# Patient Record
Sex: Male | Born: 1985
Health system: Southern US, Community
[De-identification: ages and names within clinical notes are randomized; demographics above are authoritative.]

## PROBLEM LIST (undated history)

## (undated) DIAGNOSIS — N200 Calculus of kidney: Secondary | ICD-10-CM

## (undated) DIAGNOSIS — K219 Gastro-esophageal reflux disease without esophagitis: Secondary | ICD-10-CM

## (undated) DIAGNOSIS — E669 Obesity, unspecified: Secondary | ICD-10-CM

## (undated) HISTORY — PX: HIATAL HERNIA REPAIR: SHX195

## (undated) HISTORY — DX: Calculus of kidney: N20.0

## (undated) HISTORY — DX: Gastro-esophageal reflux disease without esophagitis: K21.9

## (undated) HISTORY — PX: LACERATION REPAIR: SHX5168

## (undated) HISTORY — DX: Obesity, unspecified: E66.9

---

## 2017-01-31 ENCOUNTER — Encounter: Payer: Self-pay | Admitting: Physician Assistant

## 2017-01-31 ENCOUNTER — Ambulatory Visit (INDEPENDENT_AMBULATORY_CARE_PROVIDER_SITE_OTHER): Payer: 59 | Admitting: Physician Assistant

## 2017-01-31 VITALS — BP 145/85 | HR 64 | Ht 69.0 in | Wt 333.0 lb

## 2017-01-31 DIAGNOSIS — R030 Elevated blood-pressure reading, without diagnosis of hypertension: Secondary | ICD-10-CM | POA: Diagnosis not present

## 2017-01-31 DIAGNOSIS — Z973 Presence of spectacles and contact lenses: Secondary | ICD-10-CM | POA: Insufficient documentation

## 2017-01-31 DIAGNOSIS — Z7689 Persons encountering health services in other specified circumstances: Secondary | ICD-10-CM

## 2017-01-31 DIAGNOSIS — Z0000 Encounter for general adult medical examination without abnormal findings: Secondary | ICD-10-CM

## 2017-01-31 NOTE — Patient Instructions (Addendum)
For your blood pressure: - Check blood pressure at home for the next 2 weeks - Check around the same time each day in a relaxed setting (rest for at least 3 minutes, legs uncrossed, arm above level or your heart) - Limit salt. Follow DASH eating plan - Follow-up in 2-3 weeks   Physical Activity Recommendations for modifying lipids and lowering blood pressure Engage in aerobic physical activity to reduce LDL-cholesterol, non-HDL-cholesterol, and blood pressure  Frequency: 3-4 sessions per week  Intensity: moderate to vigorous  Duration: 40 minutes on average  Physical Activity Recommendations for secondary prevention 1. Aerobic exercise  Frequency: 3-5 sessions per week  Intensity: 50-80% capacity  Duration: 20 - 60 minutes  Examples: walking, treadmill, cycling, rowing, stair climbing, and arm/leg ergometry  2. Resistance exercise  Frequency: 2-3 sessions per week  Intensity: 10-15 repetitions/set to moderate fatigue  Duration: 1-3 sets of 8-10 upper and lower body exercises  Examples: calisthenics, elastic bands, cuff/hand weights, dumbbels, free weights, wall pulleys, and weight machines  Heart-Healthy Lifestyle  Eating a diet rich in vegetables, fruits and whole grains: also includes low-fat dairy products, poultry, fish, legumes, and nuts; limit intake of sweets, sugar-sweetened beverages and red meats  Getting regular exercise  Maintaining a healthy weight  Not smoking or getting help quitting  Staying on top of your health; for some people, lifestyle changes alone may not be enough to prevent a heart attack or stroke. In these cases, taking a statin at the right dose will most likely be necessary   DASH Eating Plan DASH stands for "Dietary Approaches to Stop Hypertension." The DASH eating plan is a healthy eating plan that has been shown to reduce high blood pressure (hypertension). It may also reduce your risk for type 2 diabetes, heart disease, and stroke.  The DASH eating plan may also help with weight loss. What are tips for following this plan? General guidelines   Avoid eating more than 2,300 mg (milligrams) of salt (sodium) a day. If you have hypertension, you may need to reduce your sodium intake to 1,500 mg a day.  Limit alcohol intake to no more than 1 drink a day for nonpregnant women and 2 drinks a day for men. One drink equals 12 oz of beer, 5 oz of wine, or 1 oz of hard liquor.  Work with your health care provider to maintain a healthy body weight or to lose weight. Ask what an ideal weight is for you.  Get at least 30 minutes of exercise that causes your heart to beat faster (aerobic exercise) most days of the week. Activities may include walking, swimming, or biking.  Work with your health care provider or diet and nutrition specialist (dietitian) to adjust your eating plan to your individual calorie needs. Reading food labels   Check food labels for the amount of sodium per serving. Choose foods with less than 5 percent of the Daily Value of sodium. Generally, foods with less than 300 mg of sodium per serving fit into this eating plan.  To find whole grains, look for the word "whole" as the first word in the ingredient list. Shopping   Buy products labeled as "low-sodium" or "no salt added."  Buy fresh foods. Avoid canned foods and premade or frozen meals. Cooking   Avoid adding salt when cooking. Use salt-free seasonings or herbs instead of table salt or sea salt. Check with your health care provider or pharmacist before using salt substitutes.  Do not fry foods. Cook foods  using healthy methods such as baking, boiling, grilling, and broiling instead.  Cook with heart-healthy oils, such as olive, canola, soybean, or sunflower oil. Meal planning    Eat a balanced diet that includes:  5 or more servings of fruits and vegetables each day. At each meal, try to fill half of your plate with fruits and vegetables.  Up to  6-8 servings of whole grains each day.  Less than 6 oz of lean meat, poultry, or fish each day. A 3-oz serving of meat is about the same size as a deck of cards. One egg equals 1 oz.  2 servings of low-fat dairy each day.  A serving of nuts, seeds, or beans 5 times each week.  Heart-healthy fats. Healthy fats called Omega-3 fatty acids are found in foods such as flaxseeds and coldwater fish, like sardines, salmon, and mackerel.  Limit how much you eat of the following:  Canned or prepackaged foods.  Food that is high in trans fat, such as fried foods.  Food that is high in saturated fat, such as fatty meat.  Sweets, desserts, sugary drinks, and other foods with added sugar.  Full-fat dairy products.  Do not salt foods before eating.  Try to eat at least 2 vegetarian meals each week.  Eat more home-cooked food and less restaurant, buffet, and fast food.  When eating at a restaurant, ask that your food be prepared with less salt or no salt, if possible. What foods are recommended? The items listed may not be a complete list. Talk with your dietitian about what dietary choices are best for you. Grains  Whole-grain or whole-wheat bread. Whole-grain or whole-wheat pasta. Brown rice. Orpah Cobb. Bulgur. Whole-grain and low-sodium cereals. Pita bread. Low-fat, low-sodium crackers. Whole-wheat flour tortillas. Vegetables  Fresh or frozen vegetables (raw, steamed, roasted, or grilled). Low-sodium or reduced-sodium tomato and vegetable juice. Low-sodium or reduced-sodium tomato sauce and tomato paste. Low-sodium or reduced-sodium canned vegetables. Fruits  All fresh, dried, or frozen fruit. Canned fruit in natural juice (without added sugar). Meat and other protein foods  Skinless chicken or [place]. Ground chicken or [place]. Pork with fat trimmed off. Fish and seafood. Egg whites. Dried beans, peas, or lentils. Unsalted nuts, nut butters, and seeds. Unsalted canned beans. Lean cuts  of beef with fat trimmed off. Low-sodium, lean deli meat. Dairy  Low-fat (1%) or fat-free (skim) milk. Fat-free, low-fat, or reduced-fat cheeses. Nonfat, low-sodium ricotta or cottage cheese. Low-fat or nonfat yogurt. Low-fat, low-sodium cheese. Fats and oils  Soft margarine without trans fats. Vegetable oil. Low-fat, reduced-fat, or light mayonnaise and salad dressings (reduced-sodium). Canola, safflower, olive, soybean, and sunflower oils. Avocado. Seasoning and other foods  Herbs. Spices. Seasoning mixes without salt. Unsalted popcorn and pretzels. Fat-free sweets. What foods are not recommended? The items listed may not be a complete list. Talk with your dietitian about what dietary choices are best for you. Grains  Baked goods made with fat, such as croissants, muffins, or some breads. Dry pasta or rice meal packs. Vegetables  Creamed or fried vegetables. Vegetables in a cheese sauce. Regular canned vegetables (not low-sodium or reduced-sodium). Regular canned tomato sauce and paste (not low-sodium or reduced-sodium). Regular tomato and vegetable juice (not low-sodium or reduced-sodium). Rosita Fire. Olives. Fruits  Canned fruit in a light or heavy syrup. Fried fruit. Fruit in cream or butter sauce. Meat and other protein foods  Fatty cuts of meat. Ribs. Fried meat. Tomasa Blase. Sausage. Bologna and other processed lunch meats. Salami. Fatback. Hotdogs. Bratwurst.  Salted nuts and seeds. Canned beans with added salt. Canned or smoked fish. Whole eggs or egg yolks. Chicken or [place] with skin. Dairy  Whole or 2% milk, cream, and half-and-half. Whole or full-fat cream cheese. Whole-fat or sweetened yogurt. Full-fat cheese. Nondairy creamers. Whipped toppings. Processed cheese and cheese spreads. Fats and oils  Butter. Stick margarine. Lard. Shortening. Ghee. Bacon fat. Tropical oils, such as coconut, palm kernel, or palm oil. Seasoning and other foods  Salted popcorn and pretzels. Onion salt, garlic  salt, seasoned salt, table salt, and sea salt. Worcestershire sauce. Tartar sauce. Barbecue sauce. Teriyaki sauce. Soy sauce, including reduced-sodium. Steak sauce. Canned and packaged gravies. Fish sauce. Oyster sauce. Cocktail sauce. Horseradish that you find on the shelf. Ketchup. Mustard. Meat flavorings and tenderizers. Bouillon cubes. Hot sauce and Tabasco sauce. Premade or packaged marinades. Premade or packaged taco seasonings. Relishes. Regular salad dressings. Where to find more information:  National Heart, Lung, and Blood Institute: PopSteam.is  American Heart Association: www.heart.org Summary  The DASH eating plan is a healthy eating plan that has been shown to reduce high blood pressure (hypertension). It may also reduce your risk for type 2 diabetes, heart disease, and stroke.  With the DASH eating plan, you should limit salt (sodium) intake to 2,300 mg a day. If you have hypertension, you may need to reduce your sodium intake to 1,500 mg a day.  When on the DASH eating plan, aim to eat more fresh fruits and vegetables, whole grains, lean proteins, low-fat dairy, and heart-healthy fats.  Work with your health care provider or diet and nutrition specialist (dietitian) to adjust your eating plan to your individual calorie needs. This information is not intended to replace advice given to you by your health care provider. Make sure you discuss any questions you have with your health care provider. Document Released: 09/15/2011 Document Revised: 09/19/2016 Document Reviewed: 09/19/2016 Elsevier Interactive Patient Education  2017 [employer].

## 2017-01-31 NOTE — Progress Notes (Signed)
HPI:                                                                Joshua Adams is a 31 y.o. male who presents to [facility]: Primary Care [facility] Medicine today to establish care and for CPE  Current Concerns include none  Health Maintenance Health Maintenance  Topic Date Due  . HIV Screening  05/23/2001  . TETANUS/TDAP  05/23/2005  . INFLUENZA VACCINE  05/10/2017     Health Habits  Diet: poor  Exercise: gym, cardio & weights x 90 mins 3 days per week  Past Medical History:  Diagnosis Date  . GERD (gastroesophageal reflux disease)   . Nephrolithiasis   . Obesity    Past Surgical History:  Procedure Laterality Date  . HIATAL HERNIA REPAIR     Social History  Substance Use Topics  . Smoking status: Never Smoker  . Smokeless tobacco: Never Used  . Alcohol use No   family history includes Diabetes in his father; Stroke in his maternal grandmother.  ROS: negative except as noted in the HPI  Medications: No current outpatient prescriptions on file.   No current facility-administered medications for this visit.    No Known Allergies     Objective:  BP (!) 145/85   Pulse 64   Ht  (1.753 m)   Wt (!) 333 lb (151 kg)   BMI 49.18 kg/m  Gen: well-groomed, morbidly obese, cooperative, not ill-appearing, no distress HEENT: normal conjunctiva, wearing glasses, TM's clear, oropharynx clear, moist mucus membranes, no thyromegaly or tenderness Pulm: Normal work of breathing, normal phonation, clear to auscultation bilaterally CV: Normal rate, regular rhythm, s1 and s2 distinct, no murmurs, clicks or rubs, no carotid bruit GI: abdomen obese, soft, nondistended, nontender Neuro: alert and oriented x 3, EOM's intact, PERRLA, DTR's intact, normal tone, no tremor MSK: moving all extremities, normal gait and station, no peripheral edema Skin: warm and dry, no rashes or lesions on exposed skin Psych: normal affect, euthymic mood, normal speech and  thought content  Depression screen [facility] 2/9 01/31/2017  Decreased Interest 0  Down, Depressed, Hopeless 0  PHQ - 2 Score 0     Assessment and Plan: 31 y.o. male with   1. Encounter to establish care   2. Annual physical exam - CBC - Comprehensive metabolic panel - Lipid Panel w/reflex Direct LDL - declined STI testing - negative depression screen  3. Severe obesity (BMI >= 40) (HCC) - Hemoglobin A1c, Lipid panel - counseled on heart healthy lifestyle to include regular cardiovascular exercise - DASH eating plan  4. Elevated blood pressure reading - instructed on how to monitor BP's at home and provided with log - DASH eating plan - follow-up in 2 weeks  Patient education and anticipatory guidance given Patient agrees with treatment plan Follow-up in 2 weeks or sooner as needed  Levonne Hubert PA-C

## 2017-02-01 LAB — COMPREHENSIVE METABOLIC PANEL
ALBUMIN: 4.3 g/dL (ref 3.6–5.1)
ALT: 17 U/L (ref 9–46)
AST: 19 U/L (ref 10–40)
Alkaline Phosphatase: 73 U/L (ref 40–115)
BUN: 14 mg/dL (ref 7–25)
CHLORIDE: 103 mmol/L (ref 98–110)
CO2: 30 mmol/L (ref 20–31)
Calcium: 9.1 mg/dL (ref 8.6–10.3)
Creat: 1 mg/dL (ref 0.60–1.35)
Glucose, Bld: 89 mg/dL (ref 65–99)
POTASSIUM: 4 mmol/L (ref 3.5–5.3)
Sodium: 139 mmol/L (ref 135–146)
TOTAL PROTEIN: 6.7 g/dL (ref 6.1–8.1)
Total Bilirubin: 0.5 mg/dL (ref 0.2–1.2)

## 2017-02-01 LAB — LIPID PANEL W/REFLEX DIRECT LDL
CHOL/HDL RATIO: 2.6 ratio (ref <5.0)
Cholesterol: 121 mg/dL (ref <200)
HDL: 47 mg/dL (ref >40)
LDL-Cholesterol: 57 mg/dL
NON-HDL CHOLESTEROL (CALC): 74 mg/dL (ref <130)
TRIGLYCERIDES: 83 mg/dL (ref <150)

## 2017-02-01 LAB — CBC
HCT: 45.2 % (ref 38.5–50.0)
Hemoglobin: 15.2 g/dL (ref 13.2–17.1)
MCH: 30.8 pg (ref 27.0–33.0)
MCHC: 33.6 g/dL (ref 32.0–36.0)
MCV: 91.5 fL (ref 80.0–100.0)
MPV: 12.4 fL (ref 7.5–12.5)
PLATELETS: 187 10*3/uL (ref 140–400)
RBC: 4.94 MIL/uL (ref 4.20–5.80)
RDW: 13.4 % (ref 11.0–15.0)
WBC: 9.2 10*3/uL (ref 3.8–10.8)

## 2017-02-01 LAB — HEMOGLOBIN A1C
Hgb A1c MFr Bld: 4.8 % (ref <5.7)
Mean Plasma Glucose: 91 mg/dL

## 2017-02-27 ENCOUNTER — Ambulatory Visit: Payer: 59 | Admitting: Physician Assistant

## 2018-08-22 ENCOUNTER — Emergency Department (INDEPENDENT_AMBULATORY_CARE_PROVIDER_SITE_OTHER): Payer: BLUE CROSS/BLUE SHIELD

## 2018-08-22 ENCOUNTER — Encounter: Payer: Self-pay | Admitting: Emergency Medicine

## 2018-08-22 ENCOUNTER — Emergency Department
Admission: EM | Admit: 2018-08-22 | Discharge: 2018-08-22 | Disposition: A | Discharge status: Home / Self Care | Payer: BLUE CROSS/BLUE SHIELD | Source: Home / Self Care | Attending: Family Medicine | Admitting: Family Medicine

## 2018-08-22 DIAGNOSIS — R05 Cough: Secondary | ICD-10-CM | POA: Diagnosis not present

## 2018-08-22 DIAGNOSIS — R509 Fever, unspecified: Secondary | ICD-10-CM | POA: Diagnosis not present

## 2018-08-22 DIAGNOSIS — J209 Acute bronchitis, unspecified: Secondary | ICD-10-CM | POA: Diagnosis not present

## 2018-08-22 MED ORDER — AZITHROMYCIN 250 MG PO TABS: ORAL_TABLET | ORAL | 0 refills | Status: DC

## 2018-08-22 NOTE — ED Triage Notes (Signed)
Pt c/o fever and cough with chest congestion x1 week. Tmax 100.

## 2018-08-22 NOTE — ED Provider Notes (Signed)
[facility]    CSN: [id number] Arrival date & time: 08/22/18  1645     History   Chief Complaint Chief Complaint  Patient presents with  . Fever    HPI Joshua Adams is a 32 y.o. male.   Patient developed a mild cough about 8 or 9 days ago, without sore throat or nasal congestion.  His non-productive cough has persisted and yesterday he developed chills.  This morning he developed fever to 100.9.  He denies pleuritic pain or shortness of breath.  The history is provided by the patient.    Past Medical History:  Diagnosis Date  . GERD (gastroesophageal reflux disease)   . Nephrolithiasis   . Obesity     Patient Active Problem List   Diagnosis Date Noted  . Severe obesity (BMI >= 40) (HCC) 01/31/2017  . Elevated blood pressure reading 01/31/2017  . Wears glasses 01/31/2017    Past Surgical History:  Procedure Laterality Date  . HIATAL HERNIA REPAIR         Home Medications    Prior to Admission medications   Medication Sig Start Date End Date Taking? Authorizing Provider  azithromycin (ZITHROMAX Z-PAK) 250 MG tablet Take 2 tabs today; then begin one tab once daily for 4 more days. 08/22/18   Lattie HawBeese, Jovana Rembold A, MD    Family History Family History  Problem Relation Age of Onset  . Diabetes Father   . Stroke Maternal Grandmother   . Cancer Neg Hx   . Heart attack Neg Hx     Social History Social History   Tobacco Use  . Smoking status: Never Smoker  . Smokeless tobacco: Never Used  Substance Use Topics  . Alcohol use: No  . Drug use: No     Allergies   Patient has no known allergies.   Review of Systems Review of Systems No sore throat + cough No pleuritic pain No wheezing ? nasal congestion No post-nasal drainage No sinus pain/pressure No itchy/red eyes No earache No hemoptysis No SOB No fever, + chills No nausea No vomiting No abdominal pain No diarrhea No urinary symptoms No skin rash + fatigue No  myalgias No headache Used OTC meds without relief   Physical Exam Triage Vital Signs ED Triage Vitals  Enc Vitals Group     BP 08/22/18 1721 (!) 156/75     Pulse Rate 08/22/18 1721 90     Resp --      Temp 08/22/18 1721 99.5 F (37.5 C)     Temp Source 08/22/18 1721 Oral     SpO2 08/22/18 1721 97 %     Weight 08/22/18 1722 (!) 315 lb (142.9 kg)     Height --      Head Circumference --      Peak Flow --      Pain Score 08/22/18 1721 0     Pain Loc --      Pain Edu? --      Excl. in GC? --    No data found.  Updated Vital Signs BP (!) 156/75 (BP Location: Right Arm)   Pulse 90   Temp 99.5 F (37.5 C) (Oral)   Wt (!) 142.9 kg   SpO2 97%   BMI 46.52 kg/m   Visual Acuity Right Eye Distance:   Left Eye Distance:   Bilateral Distance:    Right Eye Near:   Left Eye Near:    Bilateral Near:     Physical Exam Nursing notes  and Vital Signs reviewed. Appearance:  Patient appears stated age, and in no acute distress Eyes:  Pupils are equal, round, and reactive to light and accomodation.  Extraocular movement is intact.  Conjunctivae are not inflamed  Ears:  Canals normal.  Tympanic membranes normal.  Nose:  Mildly congested turbinates.  No sinus tenderness.  Pharynx:  Normal Neck:  Supple.  Enlarged posterior/lateral nodes are palpated bilaterally, tender to palpation on the left.   Lungs:  Clear to auscultation.  Breath sounds are equal.  Moving air well. Heart:  Regular rate and rhythm without murmurs, rubs, or gallops.  Rate 104 Abdomen:  Nontender without masses or hepatosplenomegaly.  Bowel sounds are present.  No CVA or flank tenderness.  Extremities:  No edema.  Skin:  No rash present.    UC Treatments / Results  Labs (all labs ordered are listed, but only abnormal results are displayed) Labs Reviewed - No data to display  EKG None  Radiology Dg Chest 2 View  Result Date: 08/22/2018 CLINICAL DATA:  Fever and cough for 1 week. EXAM: CHEST - 2 VIEW  COMPARISON:  None. FINDINGS: The heart size and mediastinal contours are within normal limits. Both lungs are clear. Mild thoracic dextroscoliosis noted. IMPRESSION: No active cardiopulmonary disease. Electronically Signed   By: Myles Rosenthal M.D.   On: 08/22/2018 18:14    Procedures Procedures (including critical care time)  Medications Ordered in UC Medications - No data to display  Initial Impression / Assessment and Plan / UC Course  I have reviewed the triage vital signs and the nursing notes.  Pertinent labs & imaging results that were available during my care of the patient were reviewed by me and considered in my medical decision making (see chart for details).    Begin Z-pak for atypical coverage. Followup with Family Doctor if not improved in one week.    Final Clinical Impressions(s) / UC Diagnoses   Final diagnoses:  Acute bronchitis, unspecified organism     Discharge Instructions     Take plain guaifenesin (1200mg  extended release tabs such as Mucinex) twice daily, with plenty of water, for cough and congestion.  If needed, may add Pseudoephedrine (30mg , one or two every 4 to 6 hours) for sinus congestion.  Get adequate rest.   May take Delsym Cough Suppressant at bedtime for nighttime cough.  Stop all antihistamines for now, and other non-prescription cough/cold preparations. May take Ibuprofen 200mg , 4 tabs every 8 hours with food for chest/sternum discomfort.      ED Prescriptions    Medication Sig Dispense Auth. Provider   azithromycin (ZITHROMAX Z-PAK) 250 MG tablet Take 2 tabs today; then begin one tab once daily for 4 more days. 6 tablet Lattie Haw, MD        Lattie Haw, MD 08/25/18 [phone]

## 2018-08-22 NOTE — Discharge Instructions (Addendum)
Take plain guaifenesin (1200mg  extended release tabs such as Mucinex) twice daily, with plenty of water, for cough and congestion.  If needed, may add Pseudoephedrine (30mg , one or two every 4 to 6 hours) for sinus congestion.  Get adequate rest.   May take Delsym Cough Suppressant at bedtime for nighttime cough.  Stop all antihistamines for now, and other non-prescription cough/cold preparations. May take Ibuprofen 200mg , 4 tabs every 8 hours with food for chest/sternum discomfort.

## 2018-08-23 ENCOUNTER — Ambulatory Visit: Payer: 59 | Admitting: Osteopathic Medicine

## 2019-05-14 ENCOUNTER — Ambulatory Visit: Payer: BLUE CROSS/BLUE SHIELD | Admitting: Physician Assistant

## 2020-03-14 DIAGNOSIS — Y92828 Other wilderness area as the place of occurrence of the external cause: Secondary | ICD-10-CM | POA: Diagnosis not present

## 2020-03-14 DIAGNOSIS — S61217A Laceration without foreign body of left little finger without damage to nail, initial encounter: Secondary | ICD-10-CM | POA: Diagnosis not present

## 2020-03-14 DIAGNOSIS — W269XXA Contact with unspecified sharp object(s), initial encounter: Secondary | ICD-10-CM | POA: Diagnosis not present

## 2020-03-14 DIAGNOSIS — W268XXA Contact with other sharp object(s), not elsewhere classified, initial encounter: Secondary | ICD-10-CM | POA: Diagnosis not present

## 2020-03-14 DIAGNOSIS — Y9389 Activity, other specified: Secondary | ICD-10-CM | POA: Diagnosis not present

## 2020-03-14 DIAGNOSIS — M79645 Pain in left finger(s): Secondary | ICD-10-CM | POA: Diagnosis not present

## 2020-03-16 ENCOUNTER — Encounter: Payer: Self-pay | Admitting: Medical-Surgical

## 2020-03-16 ENCOUNTER — Ambulatory Visit (INDEPENDENT_AMBULATORY_CARE_PROVIDER_SITE_OTHER): Payer: BC Managed Care – PPO | Admitting: Medical-Surgical

## 2020-03-16 VITALS — BP 140/77 | HR 64 | Temp 98.3°F | Wt 315.0 lb

## 2020-03-16 DIAGNOSIS — Z09 Encounter for follow-up examination after completed treatment for conditions other than malignant neoplasm: Secondary | ICD-10-CM | POA: Diagnosis not present

## 2020-03-16 DIAGNOSIS — S61217A Laceration without foreign body of left little finger without damage to nail, initial encounter: Secondary | ICD-10-CM | POA: Insufficient documentation

## 2020-03-16 DIAGNOSIS — S61217D Laceration without foreign body of left little finger without damage to nail, subsequent encounter: Secondary | ICD-10-CM | POA: Diagnosis not present

## 2020-03-16 MED ORDER — HYDROCODONE-ACETAMINOPHEN 5-325 MG PO TABS: 1.0000 | ORAL_TABLET | Freq: Three times a day (TID) | ORAL | 0 refills | Status: AC | PRN

## 2020-03-16 NOTE — Progress Notes (Signed)
New Patient Office Visit  Subjective:  Patient ID: Tommey Barret, male    DOB: Nov 07, 1985  Age: 34 y.o. MRN: [id number]  CC: No chief complaint on file.   HPI Lamar Meter presents to establish care.  Was seen at the ER on Saturday after injuring his left fifth digit on his fishing boat.  Reports he was in the ED for approximately 6 hours prior to having the sutures placed.  They discharged him from the emergency room with Keflex 4 times daily and instructions to keep the dressing dry for at least 48 hours.  He is taking Keflex as prescribed, tolerating well without side effects.  Taking ibuprofen/Tylenol for pain which are ineffective.  Reports the pain is mostly located at the tip of his finger where they identified a small fracture in addition to the complex laceration.  Denies fever, chills, shortness of breath, chest pain.  Past Medical History:  Diagnosis Date  . GERD (gastroesophageal reflux disease)   . Nephrolithiasis   . Obesity     Past Surgical History:  Procedure Laterality Date  . HIATAL HERNIA REPAIR    . LACERATION REPAIR Left    Pinky finger - 12 sutures    Family History  Problem Relation Age of Onset  . Diabetes Father   . Stroke Maternal Grandmother   . Cancer Neg Hx   . Heart attack Neg Hx     Social History   Socioeconomic History  . Marital status: Married    Spouse name: Not on file  . Number of children: Not on file  . Years of education: Not on file  . Highest education level: Not on file  Occupational History  . Occupation: Education officer, museum: [employer]  Tobacco Use  . Smoking status: Never Smoker  . Smokeless tobacco: Never Used  Substance and Sexual Activity  . Alcohol use: Never  . Drug use: Never  . Sexual activity: Yes    Partners: Female  Other Topics Concern  . Not on file  Social History Narrative  . Not on file   Social Determinants of Health   Financial Resource Strain:   . Difficulty of Paying  Living Expenses:   Food Insecurity:   . Worried About Programme researcher, broadcasting/film/video in the Last Year:   . Barista in the Last Year:   Transportation Needs:   . Freight forwarder (Medical):   Marland Kitchen Lack of Transportation (Non-Medical):   Physical Activity:   . Days of Exercise per Week:   . Minutes of Exercise per Session:   Stress:   . Feeling of Stress :   Social Connections:   . Frequency of Communication with Friends and Family:   . Frequency of Social Gatherings with Friends and Family:   . Attends Religious Services:   . Active Member of Clubs or Organizations:   . Attends Banker Meetings:   Marland Kitchen Marital Status:   Intimate Partner Violence:   . Fear of Current or Ex-Partner:   . Emotionally Abused:   Marland Kitchen Physically Abused:   . Sexually Abused:     ROS Review of Systems  Constitutional: Negative for chills, fatigue and fever.  Respiratory: Negative for cough, chest tightness and shortness of breath.   Cardiovascular: Negative for chest pain, palpitations and leg swelling.  Skin: Positive for wound (See clinical photos).    Objective:   Today's Vitals: BP 140/77 (BP Location: Right Arm, Patient Position: Sitting, Cuff  Size: Large)   Pulse 64   Temp 98.3 F (36.8 C) (Oral)   Wt (!) 315 lb 0.6 oz (142.9 kg)   BMI 46.52 kg/m   Physical Exam Vitals reviewed.  Constitutional:      General: He is not in acute distress.    Appearance: Normal appearance. He is obese.  HENT:     Head: Normocephalic and atraumatic.  Cardiovascular:     Rate and Rhythm: Normal rate and regular rhythm.     Pulses: Normal pulses.     Heart sounds: Normal heart sounds. No murmur. No friction rub. No gallop.   Pulmonary:     Effort: Pulmonary effort is normal. No respiratory distress.     Breath sounds: Normal breath sounds. No wheezing.  Skin:    General: Skin is warm and dry.     Comments: See clinical photo.  Dressing removed, old serosanguineous drainage present.    Neurological:     Mental Status: He is alert and oriented to person, place, and time.     Cranial Nerves: No cranial nerve deficit.  Psychiatric:        Mood and Affect: Mood normal.        Behavior: Behavior normal.        Thought Content: Thought content normal.        Judgment: Judgment normal.         Assessment & Plan:   1. Hospital discharge follow-up Patient brought paperwork from his hospital visit.  Paperwork and recommendations reviewed.  2. Laceration of left little finger, foreign body presence unspecified, nail damage status unspecified, subsequent encounter Continue Keflex 4 times daily until course is complete.  Sending in [place] 3 times daily as needed for pain.  Dry gauze dressing replaced to finger.  Advised patient to keep area clean and dry, wash with soap and water daily, and change dry gauze dressing daily.  Reviewed emergency symptoms to monitor for including fever, chills, worsening pain, increasing redness, or purulent drainage.  Patient due to have sutures removed in approximately 12 days.  Patient reports he is already been charged for suture removal at the emergency room he went to so he will probably go back there to have them taken out.  Advised patient that if he does not go back to that emergency room for removal, he is welcome to come here and we will remove them.  Outpatient Encounter Medications as of 03/16/2020  Medication Sig  . cephALEXin (KEFLEX) 500 MG capsule Take 500 mg by mouth 4 (four) times daily.  Marland Kitchen HYDROcodone-acetaminophen (NORCO/VICODIN) 5-325 MG tablet Take 1 tablet by mouth 3 (three) times daily as needed for up to 5 days for moderate pain.  . [DISCONTINUED] azithromycin (ZITHROMAX Z-PAK) 250 MG tablet Take 2 tabs today; then begin one tab once daily for 4 more days. (Patient not taking: Reported on 03/16/2020)   No facility-administered encounter medications on file as of 03/16/2020.    Follow-up: Return in about 12 days (around  03/28/2020) for suture removal.   Samuel Bouche, NP

## 2020-03-27 ENCOUNTER — Ambulatory Visit (INDEPENDENT_AMBULATORY_CARE_PROVIDER_SITE_OTHER): Payer: BC Managed Care – PPO | Admitting: Medical-Surgical

## 2020-03-27 ENCOUNTER — Encounter: Payer: Self-pay | Admitting: Medical-Surgical

## 2020-03-27 ENCOUNTER — Other Ambulatory Visit: Payer: Self-pay

## 2020-03-27 VITALS — BP 145/76 | HR 66 | Temp 98.0°F | Ht 69.0 in | Wt 314.5 lb

## 2020-03-27 DIAGNOSIS — S61217D Laceration without foreign body of left little finger without damage to nail, subsequent encounter: Secondary | ICD-10-CM | POA: Diagnosis not present

## 2020-03-27 NOTE — Progress Notes (Signed)
Subjective:    CC: Suture removal  HPI: Pleasant 34 year old male presenting today for removal of sutures to left fifth finger.  Was seen on 6/7 for hospital discharge after cutting his finger while on his fishing boat.  Was instructed to have the sutures removed after they had been in place for 14 days.  Was treated with prophylactic Keflex which he took without difficulty and tolerated well.  Has been taking good care of his injury, washing it daily with soap and water and keeping it covered to avoid getting it dirty while at work.  Has still had a little bit of drainage from the lateral side but no overt concern for infection.  I reviewed the past medical history, family history, social history, surgical history, and allergies today and no changes were needed.  Please see the problem list section below in epic for further details.  Past Medical History: Past Medical History:  Diagnosis Date  . GERD (gastroesophageal reflux disease)   . Nephrolithiasis   . Obesity    Past Surgical History: Past Surgical History:  Procedure Laterality Date  . HIATAL HERNIA REPAIR    . LACERATION REPAIR Left    Pinky finger - 12 sutures   Social History: Social History   Socioeconomic History  . Marital status: Married    Spouse name: Not on file  . Number of children: Not on file  . Years of education: Not on file  . Highest education level: Not on file  Occupational History  . Occupation: Engineer, structural: [employer]  Tobacco Use  . Smoking status: Never Smoker  . Smokeless tobacco: Never Used  Vaping Use  . Vaping Use: Never used  Substance and Sexual Activity  . Alcohol use: Never  . Drug use: Never  . Sexual activity: Yes    Partners: Female  Other Topics Concern  . Not on file  Social History Narrative  . Not on file   Social Determinants of Health   Financial Resource Strain:   . Difficulty of Paying Living Expenses:   Food Insecurity:   .  Worried About Charity fundraiser in the Last Year:   . Arboriculturist in the Last Year:   Transportation Needs:   . Film/video editor (Medical):   Marland Kitchen Lack of Transportation (Non-Medical):   Physical Activity:   . Days of Exercise per Week:   . Minutes of Exercise per Session:   Stress:   . Feeling of Stress :   Social Connections:   . Frequency of Communication with Friends and Family:   . Frequency of Social Gatherings with Friends and Family:   . Attends Religious Services:   . Active Member of Clubs or Organizations:   . Attends Archivist Meetings:   Marland Kitchen Marital Status:    Family History: Family History  Problem Relation Age of Onset  . Diabetes Father   . Stroke Maternal Grandmother   . Cancer Neg Hx   . Heart attack Neg Hx    Allergies: No Known Allergies Medications: See med rec.  Review of Systems: See HPI for pertinent positives and negatives.   Objective:    General: Well Developed, well nourished, and in no acute distress.  Neuro: Alert and oriented x3.  HEENT: Normocephalic, atraumatic.  Skin: Warm and dry. See clinical photo. 12 sutures successfully removed.  Three-quarter inch Steri-Strips applied for extra support and the finger covered with folded 4 x 4 gauze,  wrapped with rolled gauze and secured with [place]. Cardiac: Regular rate and rhythm, no murmurs rubs or gallops, no lower extremity edema.  Respiratory: Clear to auscultation bilaterally. Not using accessory muscles, speaking in full sentences.       Impression and Recommendations:    1. Laceration of left little finger, foreign body presence unspecified, nail damage status unspecified, subsequent encounter Sutures removed successfully.  Advised patient to monitor for worsening drainage, pain, warmth, swelling.  Steri-Strips in place, advised patient that these will likely stay in place for about 7 days.  Wash wound daily with warm water and soap and keep covered while at work.  Okay  to leave open to air while at home.  Due for annual physical exam with labs.  Return for CPE at your convenience or sooner if needed.  ___________________________________________ Thayer Ohm, DNP, APRN, FNP-BC Primary Care and Sports Medicine [facility] [place]

## 2020-05-27 IMAGING — DX DG CHEST 2V
2 series · 2 of 2 positions shown · non-contrast
Comparison: None.

CLINICAL DATA: Fever and cough for 1 week.

EXAM:
CHEST - 2 VIEW

[chest pa]
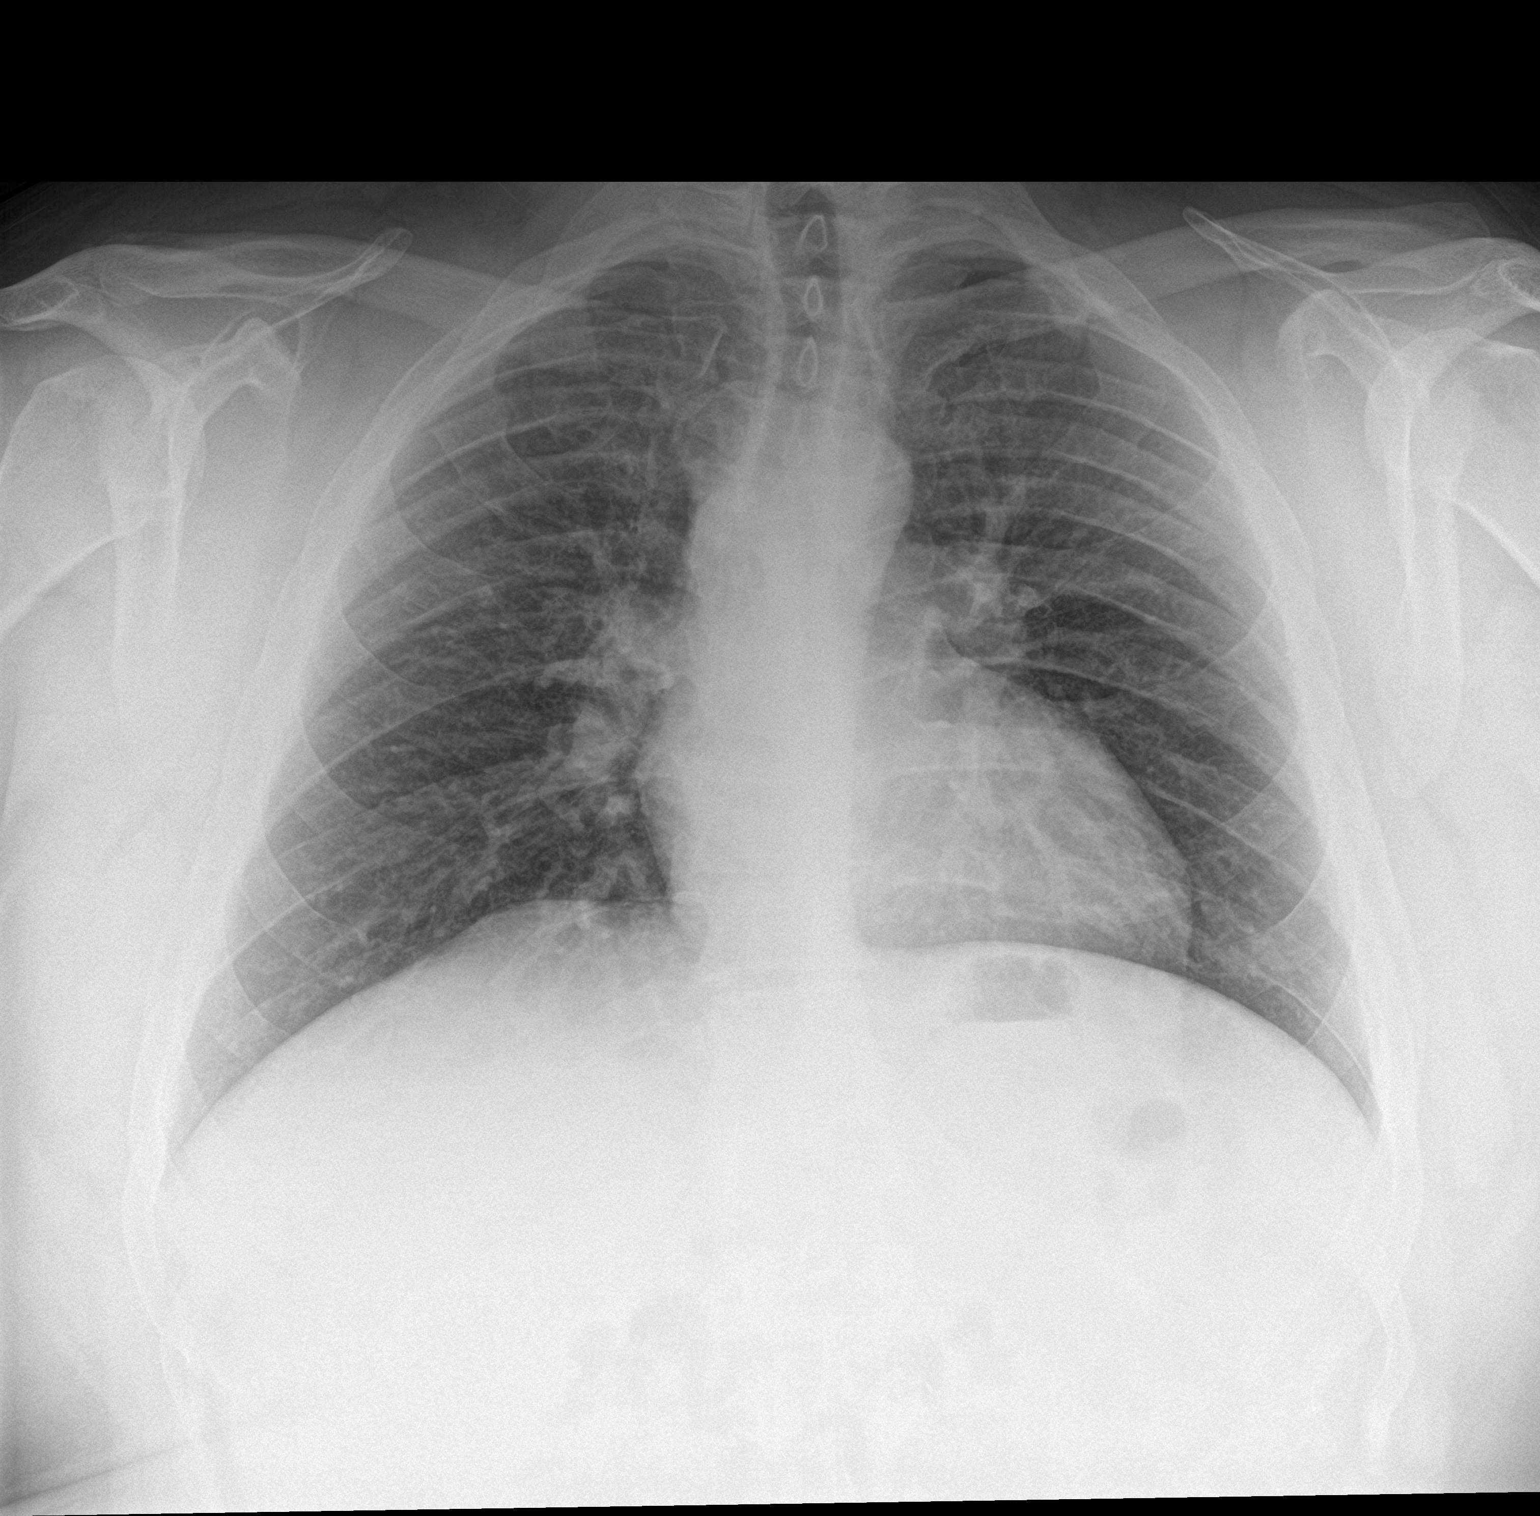

[chest lat]
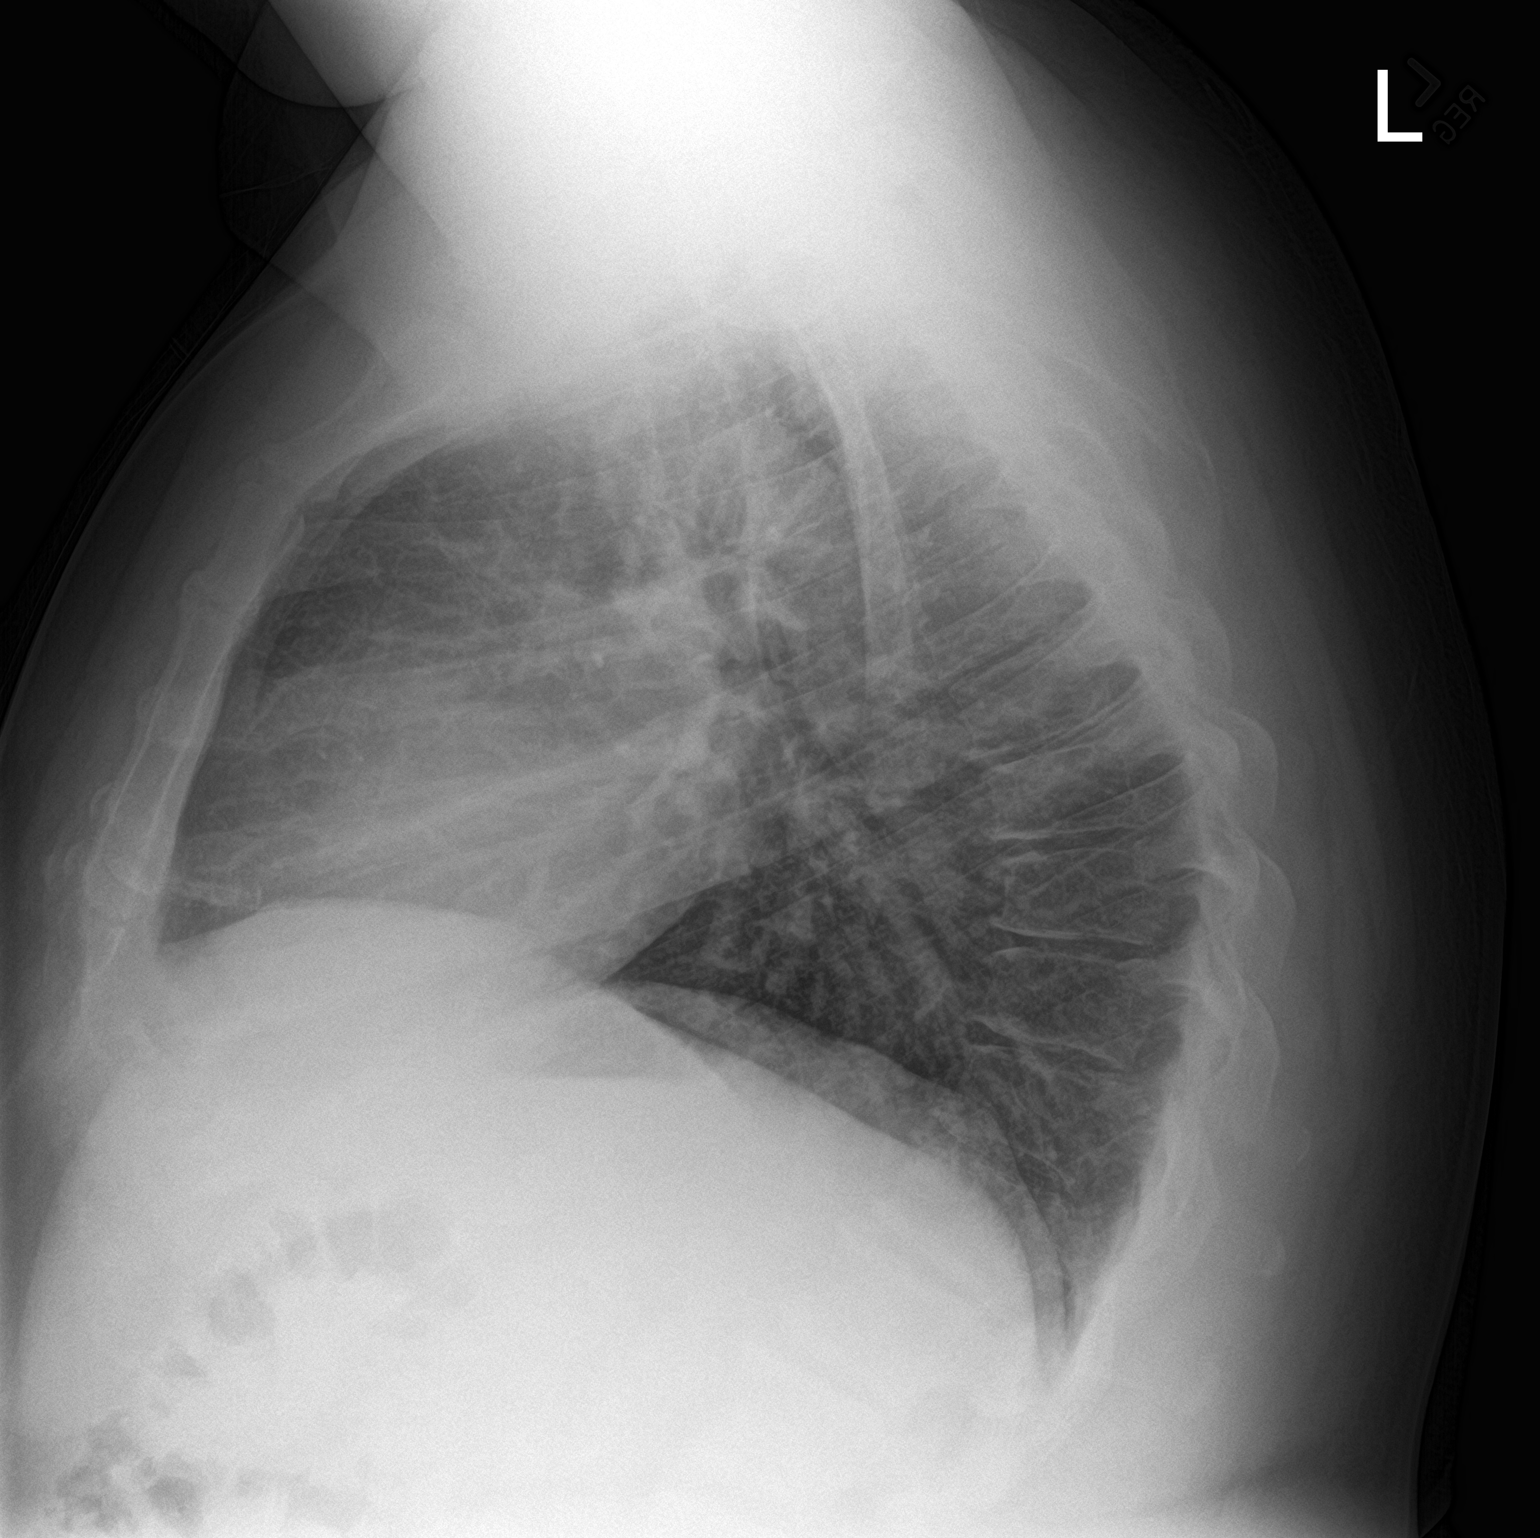

[2 of 2 positions shown; findings below may reference images not displayed]

FINDINGS: The heart size and mediastinal contours are within normal limits.
Both lungs are clear. Mild thoracic dextroscoliosis noted.
IMPRESSION: No active cardiopulmonary disease.

## 2020-10-06 DIAGNOSIS — I517 Cardiomegaly: Secondary | ICD-10-CM | POA: Diagnosis not present

## 2020-10-06 DIAGNOSIS — R0781 Pleurodynia: Secondary | ICD-10-CM | POA: Diagnosis not present

## 2020-10-06 DIAGNOSIS — R079 Chest pain, unspecified: Secondary | ICD-10-CM | POA: Diagnosis not present

## 2020-10-06 DIAGNOSIS — R059 Cough, unspecified: Secondary | ICD-10-CM | POA: Diagnosis not present

## 2020-10-06 DIAGNOSIS — R438 Other disturbances of smell and taste: Secondary | ICD-10-CM | POA: Diagnosis not present

## 2020-10-06 DIAGNOSIS — R918 Other nonspecific abnormal finding of lung field: Secondary | ICD-10-CM | POA: Diagnosis not present

## 2020-10-06 DIAGNOSIS — U071 COVID-19: Secondary | ICD-10-CM | POA: Diagnosis not present

## 2020-10-06 DIAGNOSIS — J811 Chronic pulmonary edema: Secondary | ICD-10-CM | POA: Diagnosis not present

## 2020-10-20 ENCOUNTER — Other Ambulatory Visit: Payer: Self-pay

## 2020-10-20 ENCOUNTER — Encounter: Payer: Self-pay | Admitting: Medical-Surgical

## 2020-10-20 ENCOUNTER — Ambulatory Visit (INDEPENDENT_AMBULATORY_CARE_PROVIDER_SITE_OTHER): Payer: BC Managed Care – PPO

## 2020-10-20 ENCOUNTER — Ambulatory Visit (INDEPENDENT_AMBULATORY_CARE_PROVIDER_SITE_OTHER): Payer: BC Managed Care – PPO | Admitting: Medical-Surgical

## 2020-10-20 VITALS — BP 117/78 | HR 86 | Temp 98.5°F | Ht 69.0 in | Wt 321.1 lb

## 2020-10-20 DIAGNOSIS — I83891 Varicose veins of right lower extremities with other complications: Secondary | ICD-10-CM | POA: Diagnosis not present

## 2020-10-20 DIAGNOSIS — I83813 Varicose veins of bilateral lower extremities with pain: Secondary | ICD-10-CM | POA: Diagnosis not present

## 2020-10-20 DIAGNOSIS — I82811 Embolism and thrombosis of superficial veins of right lower extremities: Secondary | ICD-10-CM

## 2020-10-20 DIAGNOSIS — R600 Localized edema: Secondary | ICD-10-CM

## 2020-10-20 DIAGNOSIS — M79604 Pain in right leg: Secondary | ICD-10-CM | POA: Diagnosis not present

## 2020-10-20 DIAGNOSIS — I82611 Acute embolism and thrombosis of superficial veins of right upper extremity: Secondary | ICD-10-CM | POA: Diagnosis not present

## 2020-10-20 MED ORDER — RIVAROXABAN 10 MG PO TABS: 10.0000 mg | ORAL_TABLET | Freq: Every day | ORAL | 0 refills | Status: AC

## 2020-10-20 NOTE — Progress Notes (Signed)
Subjective:    CC: Leg pain/redness/swelling  HPI: Pleasant 35 year old male presenting today with onset of leg pain, redness, and swelling over the weekend.  He does have a strong history of significant varicose veins.  Notes that he occasionally develops some pain in his legs but this usually resolves spontaneously after couple of days.  Unfortunately his pain and swelling have been pretty severe this time around.  He was encouraged by his significant other to be evaluated for a DVT.  Has not seen a vein specialist before and does not wear compression stockings/socks.  Notes that after being on his feet all day, his lower extremities do swell but they usually returned to normal by morning.  Denies fever, chills, shortness of breath, and chest pain.  Has not tried any home remedies for his leg pain so far.  I reviewed the past medical history, family history, social history, surgical history, and allergies today and no changes were needed.  Please see the problem list section below in epic for further details.  Past Medical History: Past Medical History:  Diagnosis Date  . GERD (gastroesophageal reflux disease)   . Nephrolithiasis   . Obesity    Past Surgical History: Past Surgical History:  Procedure Laterality Date  . HIATAL HERNIA REPAIR    . LACERATION REPAIR Left    Pinky finger - 12 sutures   Social History: Social History   Socioeconomic History  . Marital status: Married    Spouse name: Not on file  . Number of children: Not on file  . Years of education: Not on file  . Highest education level: Not on file  Occupational History  . 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 Smoking status: Never Smoker  . Smokeless tobacco: Never Used  Vaping Use  . Vaping Use: Never used  Substance and Sexual Activity  . Alcohol use: Never  . Drug use: Never  . Sexual activity: Yes    Partners: Female  Other Topics Concern  . Not on file   Social History Narrative  . Not on file   Social Determinants of Health   Financial Resource Strain: Not on file  Food Insecurity: Not on file  Transportation Needs: Not on file  Physical Activity: Not on file  Stress: Not on file  Social Connections: Not on file   Family History: Family History  Problem Relation Age of Onset  . Diabetes Father   . Stroke Maternal Grandmother   . Cancer Neg Hx   . Heart attack Neg Hx    Allergies: No Known Allergies Medications: See med rec.  Review of Systems: See HPI for pertinent positives and negatives.   Objective:    General: Well Developed, well nourished, and in no acute distress.  Neuro: Alert and oriented x3.  HEENT: Normocephalic, atraumatic.  Skin: Warm and dry. Cardiac: Regular rate and rhythm, no murmurs rubs or gallops, + lower extremity edema bilaterally.  Tortuous varicose veins noted in bilateral lower extremities from the thigh to ankle.  Medial lower posterior thigh and lateral posterior calf just below the knee both edematous, hot with mild erythema and firm to touch.  Varicose veins in the area of symptoms palpable, corded. Respiratory: Clear to auscultation bilaterally. Not using accessory muscles, speaking in full sentences.  Impression and Recommendations:    1. Varicose veins of both lower extremities with pain and edema Bilateral lower extremity ultrasound completed stat.  Superficial venous thrombosis noted in  both areas of concern along the right thigh and calf.  No venous thrombosis noted in the left extremity.  Due to proximity to the great saphenous vein, starting Xarelto 10 mg daily x45 days per up-to-date recommendations.  Discussed conservative management with patient during results phone call.  Referring to [facility] in [place] per patient request.  Strongly recommend obtaining and wearing compression stockings/socks - US Venous Img Lower Bilateral; Future  Return for Follow-up  of leg pain/swelling in 3-4 weeks.  ___________________________________________ Thayer Ohm, DNP, APRN, FNP-BC Primary Care and Sports Medicine [facility] [place]

## 2020-10-22 DIAGNOSIS — I82811 Embolism and thrombosis of superficial veins of right lower extremities: Secondary | ICD-10-CM | POA: Diagnosis not present

## 2020-10-22 DIAGNOSIS — I83819 Varicose veins of unspecified lower extremities with pain: Secondary | ICD-10-CM | POA: Diagnosis not present

## 2020-12-10 DIAGNOSIS — M9903 Segmental and somatic dysfunction of lumbar region: Secondary | ICD-10-CM | POA: Diagnosis not present

## 2020-12-10 DIAGNOSIS — M4725 Other spondylosis with radiculopathy, thoracolumbar region: Secondary | ICD-10-CM | POA: Diagnosis not present

## 2020-12-10 DIAGNOSIS — M9904 Segmental and somatic dysfunction of sacral region: Secondary | ICD-10-CM | POA: Diagnosis not present

## 2020-12-10 DIAGNOSIS — M9902 Segmental and somatic dysfunction of thoracic region: Secondary | ICD-10-CM | POA: Diagnosis not present

## 2020-12-14 DIAGNOSIS — M9902 Segmental and somatic dysfunction of thoracic region: Secondary | ICD-10-CM | POA: Diagnosis not present

## 2020-12-14 DIAGNOSIS — M9903 Segmental and somatic dysfunction of lumbar region: Secondary | ICD-10-CM | POA: Diagnosis not present

## 2020-12-14 DIAGNOSIS — M4725 Other spondylosis with radiculopathy, thoracolumbar region: Secondary | ICD-10-CM | POA: Diagnosis not present

## 2020-12-14 DIAGNOSIS — M9904 Segmental and somatic dysfunction of sacral region: Secondary | ICD-10-CM | POA: Diagnosis not present

## 2020-12-15 DIAGNOSIS — M9902 Segmental and somatic dysfunction of thoracic region: Secondary | ICD-10-CM | POA: Diagnosis not present

## 2020-12-15 DIAGNOSIS — M4725 Other spondylosis with radiculopathy, thoracolumbar region: Secondary | ICD-10-CM | POA: Diagnosis not present

## 2020-12-15 DIAGNOSIS — M9903 Segmental and somatic dysfunction of lumbar region: Secondary | ICD-10-CM | POA: Diagnosis not present

## 2020-12-15 DIAGNOSIS — M9904 Segmental and somatic dysfunction of sacral region: Secondary | ICD-10-CM | POA: Diagnosis not present

## 2020-12-16 DIAGNOSIS — M9902 Segmental and somatic dysfunction of thoracic region: Secondary | ICD-10-CM | POA: Diagnosis not present

## 2020-12-16 DIAGNOSIS — M9904 Segmental and somatic dysfunction of sacral region: Secondary | ICD-10-CM | POA: Diagnosis not present

## 2020-12-16 DIAGNOSIS — M9903 Segmental and somatic dysfunction of lumbar region: Secondary | ICD-10-CM | POA: Diagnosis not present

## 2020-12-16 DIAGNOSIS — M4725 Other spondylosis with radiculopathy, thoracolumbar region: Secondary | ICD-10-CM | POA: Diagnosis not present

## 2020-12-21 DIAGNOSIS — M9903 Segmental and somatic dysfunction of lumbar region: Secondary | ICD-10-CM | POA: Diagnosis not present

## 2020-12-21 DIAGNOSIS — M4725 Other spondylosis with radiculopathy, thoracolumbar region: Secondary | ICD-10-CM | POA: Diagnosis not present

## 2020-12-21 DIAGNOSIS — M9902 Segmental and somatic dysfunction of thoracic region: Secondary | ICD-10-CM | POA: Diagnosis not present

## 2020-12-21 DIAGNOSIS — M9904 Segmental and somatic dysfunction of sacral region: Secondary | ICD-10-CM | POA: Diagnosis not present

## 2020-12-22 DIAGNOSIS — M4725 Other spondylosis with radiculopathy, thoracolumbar region: Secondary | ICD-10-CM | POA: Diagnosis not present

## 2020-12-22 DIAGNOSIS — M9902 Segmental and somatic dysfunction of thoracic region: Secondary | ICD-10-CM | POA: Diagnosis not present

## 2020-12-22 DIAGNOSIS — M9904 Segmental and somatic dysfunction of sacral region: Secondary | ICD-10-CM | POA: Diagnosis not present

## 2020-12-22 DIAGNOSIS — M9903 Segmental and somatic dysfunction of lumbar region: Secondary | ICD-10-CM | POA: Diagnosis not present

## 2020-12-23 DIAGNOSIS — M9904 Segmental and somatic dysfunction of sacral region: Secondary | ICD-10-CM | POA: Diagnosis not present

## 2020-12-23 DIAGNOSIS — M9903 Segmental and somatic dysfunction of lumbar region: Secondary | ICD-10-CM | POA: Diagnosis not present

## 2020-12-23 DIAGNOSIS — M9902 Segmental and somatic dysfunction of thoracic region: Secondary | ICD-10-CM | POA: Diagnosis not present

## 2020-12-23 DIAGNOSIS — M4725 Other spondylosis with radiculopathy, thoracolumbar region: Secondary | ICD-10-CM | POA: Diagnosis not present

## 2020-12-29 DIAGNOSIS — M4725 Other spondylosis with radiculopathy, thoracolumbar region: Secondary | ICD-10-CM | POA: Diagnosis not present

## 2020-12-29 DIAGNOSIS — M9903 Segmental and somatic dysfunction of lumbar region: Secondary | ICD-10-CM | POA: Diagnosis not present

## 2020-12-29 DIAGNOSIS — M9904 Segmental and somatic dysfunction of sacral region: Secondary | ICD-10-CM | POA: Diagnosis not present

## 2020-12-29 DIAGNOSIS — M9902 Segmental and somatic dysfunction of thoracic region: Secondary | ICD-10-CM | POA: Diagnosis not present

## 2020-12-30 DIAGNOSIS — M9903 Segmental and somatic dysfunction of lumbar region: Secondary | ICD-10-CM | POA: Diagnosis not present

## 2020-12-30 DIAGNOSIS — M4725 Other spondylosis with radiculopathy, thoracolumbar region: Secondary | ICD-10-CM | POA: Diagnosis not present

## 2020-12-30 DIAGNOSIS — M9902 Segmental and somatic dysfunction of thoracic region: Secondary | ICD-10-CM | POA: Diagnosis not present

## 2020-12-30 DIAGNOSIS — M9904 Segmental and somatic dysfunction of sacral region: Secondary | ICD-10-CM | POA: Diagnosis not present

## 2020-12-31 DIAGNOSIS — M9903 Segmental and somatic dysfunction of lumbar region: Secondary | ICD-10-CM | POA: Diagnosis not present

## 2020-12-31 DIAGNOSIS — M9902 Segmental and somatic dysfunction of thoracic region: Secondary | ICD-10-CM | POA: Diagnosis not present

## 2020-12-31 DIAGNOSIS — M9904 Segmental and somatic dysfunction of sacral region: Secondary | ICD-10-CM | POA: Diagnosis not present

## 2020-12-31 DIAGNOSIS — M4725 Other spondylosis with radiculopathy, thoracolumbar region: Secondary | ICD-10-CM | POA: Diagnosis not present

## 2021-01-04 DIAGNOSIS — M9904 Segmental and somatic dysfunction of sacral region: Secondary | ICD-10-CM | POA: Diagnosis not present

## 2021-01-04 DIAGNOSIS — M9902 Segmental and somatic dysfunction of thoracic region: Secondary | ICD-10-CM | POA: Diagnosis not present

## 2021-01-04 DIAGNOSIS — M4725 Other spondylosis with radiculopathy, thoracolumbar region: Secondary | ICD-10-CM | POA: Diagnosis not present

## 2021-01-04 DIAGNOSIS — M9903 Segmental and somatic dysfunction of lumbar region: Secondary | ICD-10-CM | POA: Diagnosis not present

## 2021-01-05 DIAGNOSIS — M4725 Other spondylosis with radiculopathy, thoracolumbar region: Secondary | ICD-10-CM | POA: Diagnosis not present

## 2021-01-05 DIAGNOSIS — M9903 Segmental and somatic dysfunction of lumbar region: Secondary | ICD-10-CM | POA: Diagnosis not present

## 2021-01-05 DIAGNOSIS — M9902 Segmental and somatic dysfunction of thoracic region: Secondary | ICD-10-CM | POA: Diagnosis not present

## 2021-01-05 DIAGNOSIS — M9904 Segmental and somatic dysfunction of sacral region: Secondary | ICD-10-CM | POA: Diagnosis not present

## 2021-01-07 DIAGNOSIS — M9902 Segmental and somatic dysfunction of thoracic region: Secondary | ICD-10-CM | POA: Diagnosis not present

## 2021-01-07 DIAGNOSIS — M9904 Segmental and somatic dysfunction of sacral region: Secondary | ICD-10-CM | POA: Diagnosis not present

## 2021-01-07 DIAGNOSIS — M4725 Other spondylosis with radiculopathy, thoracolumbar region: Secondary | ICD-10-CM | POA: Diagnosis not present

## 2021-01-07 DIAGNOSIS — M9903 Segmental and somatic dysfunction of lumbar region: Secondary | ICD-10-CM | POA: Diagnosis not present

## 2021-01-13 DIAGNOSIS — M9903 Segmental and somatic dysfunction of lumbar region: Secondary | ICD-10-CM | POA: Diagnosis not present

## 2021-01-13 DIAGNOSIS — M9904 Segmental and somatic dysfunction of sacral region: Secondary | ICD-10-CM | POA: Diagnosis not present

## 2021-01-13 DIAGNOSIS — M9902 Segmental and somatic dysfunction of thoracic region: Secondary | ICD-10-CM | POA: Diagnosis not present

## 2021-01-13 DIAGNOSIS — M4725 Other spondylosis with radiculopathy, thoracolumbar region: Secondary | ICD-10-CM | POA: Diagnosis not present

## 2021-01-19 DIAGNOSIS — M9903 Segmental and somatic dysfunction of lumbar region: Secondary | ICD-10-CM | POA: Diagnosis not present

## 2021-01-19 DIAGNOSIS — M9904 Segmental and somatic dysfunction of sacral region: Secondary | ICD-10-CM | POA: Diagnosis not present

## 2021-01-19 DIAGNOSIS — M9902 Segmental and somatic dysfunction of thoracic region: Secondary | ICD-10-CM | POA: Diagnosis not present

## 2021-01-19 DIAGNOSIS — M4725 Other spondylosis with radiculopathy, thoracolumbar region: Secondary | ICD-10-CM | POA: Diagnosis not present

## 2021-01-20 DIAGNOSIS — I83819 Varicose veins of unspecified lower extremities with pain: Secondary | ICD-10-CM | POA: Diagnosis not present

## 2021-01-21 DIAGNOSIS — M9903 Segmental and somatic dysfunction of lumbar region: Secondary | ICD-10-CM | POA: Diagnosis not present

## 2021-01-21 DIAGNOSIS — M9904 Segmental and somatic dysfunction of sacral region: Secondary | ICD-10-CM | POA: Diagnosis not present

## 2021-01-21 DIAGNOSIS — M4725 Other spondylosis with radiculopathy, thoracolumbar region: Secondary | ICD-10-CM | POA: Diagnosis not present

## 2021-01-21 DIAGNOSIS — M9902 Segmental and somatic dysfunction of thoracic region: Secondary | ICD-10-CM | POA: Diagnosis not present

## 2021-01-27 DIAGNOSIS — I83819 Varicose veins of unspecified lower extremities with pain: Secondary | ICD-10-CM | POA: Diagnosis not present

## 2021-02-02 DIAGNOSIS — M4725 Other spondylosis with radiculopathy, thoracolumbar region: Secondary | ICD-10-CM | POA: Diagnosis not present

## 2021-02-02 DIAGNOSIS — M9904 Segmental and somatic dysfunction of sacral region: Secondary | ICD-10-CM | POA: Diagnosis not present

## 2021-02-02 DIAGNOSIS — M9903 Segmental and somatic dysfunction of lumbar region: Secondary | ICD-10-CM | POA: Diagnosis not present

## 2021-02-02 DIAGNOSIS — M9902 Segmental and somatic dysfunction of thoracic region: Secondary | ICD-10-CM | POA: Diagnosis not present

## 2021-02-17 DIAGNOSIS — M9904 Segmental and somatic dysfunction of sacral region: Secondary | ICD-10-CM | POA: Diagnosis not present

## 2021-02-17 DIAGNOSIS — M4725 Other spondylosis with radiculopathy, thoracolumbar region: Secondary | ICD-10-CM | POA: Diagnosis not present

## 2021-02-17 DIAGNOSIS — M9903 Segmental and somatic dysfunction of lumbar region: Secondary | ICD-10-CM | POA: Diagnosis not present

## 2021-02-17 DIAGNOSIS — M9902 Segmental and somatic dysfunction of thoracic region: Secondary | ICD-10-CM | POA: Diagnosis not present

## 2021-02-26 DIAGNOSIS — I83812 Varicose veins of left lower extremities with pain: Secondary | ICD-10-CM | POA: Diagnosis not present

## 2021-03-03 DIAGNOSIS — M9903 Segmental and somatic dysfunction of lumbar region: Secondary | ICD-10-CM | POA: Diagnosis not present

## 2021-03-03 DIAGNOSIS — M9902 Segmental and somatic dysfunction of thoracic region: Secondary | ICD-10-CM | POA: Diagnosis not present

## 2021-03-03 DIAGNOSIS — M9904 Segmental and somatic dysfunction of sacral region: Secondary | ICD-10-CM | POA: Diagnosis not present

## 2021-03-03 DIAGNOSIS — M4725 Other spondylosis with radiculopathy, thoracolumbar region: Secondary | ICD-10-CM | POA: Diagnosis not present

## 2021-03-17 DIAGNOSIS — M9902 Segmental and somatic dysfunction of thoracic region: Secondary | ICD-10-CM | POA: Diagnosis not present

## 2021-03-17 DIAGNOSIS — M4726 Other spondylosis with radiculopathy, lumbar region: Secondary | ICD-10-CM | POA: Diagnosis not present

## 2021-03-17 DIAGNOSIS — M4728 Other spondylosis with radiculopathy, sacral and sacrococcygeal region: Secondary | ICD-10-CM | POA: Diagnosis not present

## 2021-03-17 DIAGNOSIS — M4725 Other spondylosis with radiculopathy, thoracolumbar region: Secondary | ICD-10-CM | POA: Diagnosis not present

## 2021-04-02 DIAGNOSIS — I83811 Varicose veins of right lower extremities with pain: Secondary | ICD-10-CM | POA: Diagnosis not present

## 2021-04-07 DIAGNOSIS — M4725 Other spondylosis with radiculopathy, thoracolumbar region: Secondary | ICD-10-CM | POA: Diagnosis not present

## 2021-04-07 DIAGNOSIS — M4728 Other spondylosis with radiculopathy, sacral and sacrococcygeal region: Secondary | ICD-10-CM | POA: Diagnosis not present

## 2021-04-07 DIAGNOSIS — M4726 Other spondylosis with radiculopathy, lumbar region: Secondary | ICD-10-CM | POA: Diagnosis not present

## 2021-04-07 DIAGNOSIS — M9902 Segmental and somatic dysfunction of thoracic region: Secondary | ICD-10-CM | POA: Diagnosis not present

## 2021-04-21 DIAGNOSIS — M9902 Segmental and somatic dysfunction of thoracic region: Secondary | ICD-10-CM | POA: Diagnosis not present

## 2021-04-21 DIAGNOSIS — M4728 Other spondylosis with radiculopathy, sacral and sacrococcygeal region: Secondary | ICD-10-CM | POA: Diagnosis not present

## 2021-04-21 DIAGNOSIS — M4725 Other spondylosis with radiculopathy, thoracolumbar region: Secondary | ICD-10-CM | POA: Diagnosis not present

## 2021-04-21 DIAGNOSIS — M4726 Other spondylosis with radiculopathy, lumbar region: Secondary | ICD-10-CM | POA: Diagnosis not present

## 2021-05-03 DIAGNOSIS — I83819 Varicose veins of unspecified lower extremities with pain: Secondary | ICD-10-CM | POA: Diagnosis not present

## 2021-05-05 DIAGNOSIS — M4725 Other spondylosis with radiculopathy, thoracolumbar region: Secondary | ICD-10-CM | POA: Diagnosis not present

## 2021-05-05 DIAGNOSIS — M4726 Other spondylosis with radiculopathy, lumbar region: Secondary | ICD-10-CM | POA: Diagnosis not present

## 2021-05-05 DIAGNOSIS — M4728 Other spondylosis with radiculopathy, sacral and sacrococcygeal region: Secondary | ICD-10-CM | POA: Diagnosis not present

## 2021-05-05 DIAGNOSIS — M9902 Segmental and somatic dysfunction of thoracic region: Secondary | ICD-10-CM | POA: Diagnosis not present

## 2021-05-19 DIAGNOSIS — M4726 Other spondylosis with radiculopathy, lumbar region: Secondary | ICD-10-CM | POA: Diagnosis not present

## 2021-05-19 DIAGNOSIS — M4725 Other spondylosis with radiculopathy, thoracolumbar region: Secondary | ICD-10-CM | POA: Diagnosis not present

## 2021-05-19 DIAGNOSIS — M9902 Segmental and somatic dysfunction of thoracic region: Secondary | ICD-10-CM | POA: Diagnosis not present

## 2021-05-19 DIAGNOSIS — M4728 Other spondylosis with radiculopathy, sacral and sacrococcygeal region: Secondary | ICD-10-CM | POA: Diagnosis not present

## 2021-05-27 DIAGNOSIS — M4726 Other spondylosis with radiculopathy, lumbar region: Secondary | ICD-10-CM | POA: Diagnosis not present

## 2021-05-27 DIAGNOSIS — M9902 Segmental and somatic dysfunction of thoracic region: Secondary | ICD-10-CM | POA: Diagnosis not present

## 2021-05-27 DIAGNOSIS — M4725 Other spondylosis with radiculopathy, thoracolumbar region: Secondary | ICD-10-CM | POA: Diagnosis not present

## 2021-05-27 DIAGNOSIS — M4728 Other spondylosis with radiculopathy, sacral and sacrococcygeal region: Secondary | ICD-10-CM | POA: Diagnosis not present

## 2021-06-02 DIAGNOSIS — M4728 Other spondylosis with radiculopathy, sacral and sacrococcygeal region: Secondary | ICD-10-CM | POA: Diagnosis not present

## 2021-06-02 DIAGNOSIS — M4726 Other spondylosis with radiculopathy, lumbar region: Secondary | ICD-10-CM | POA: Diagnosis not present

## 2021-06-02 DIAGNOSIS — M9902 Segmental and somatic dysfunction of thoracic region: Secondary | ICD-10-CM | POA: Diagnosis not present

## 2021-06-02 DIAGNOSIS — M4725 Other spondylosis with radiculopathy, thoracolumbar region: Secondary | ICD-10-CM | POA: Diagnosis not present

## 2021-06-09 DIAGNOSIS — M4725 Other spondylosis with radiculopathy, thoracolumbar region: Secondary | ICD-10-CM | POA: Diagnosis not present

## 2021-06-09 DIAGNOSIS — M4728 Other spondylosis with radiculopathy, sacral and sacrococcygeal region: Secondary | ICD-10-CM | POA: Diagnosis not present

## 2021-06-09 DIAGNOSIS — M4726 Other spondylosis with radiculopathy, lumbar region: Secondary | ICD-10-CM | POA: Diagnosis not present

## 2021-06-09 DIAGNOSIS — M9902 Segmental and somatic dysfunction of thoracic region: Secondary | ICD-10-CM | POA: Diagnosis not present

## 2021-06-23 DIAGNOSIS — M4726 Other spondylosis with radiculopathy, lumbar region: Secondary | ICD-10-CM | POA: Diagnosis not present

## 2021-06-23 DIAGNOSIS — M9902 Segmental and somatic dysfunction of thoracic region: Secondary | ICD-10-CM | POA: Diagnosis not present

## 2021-06-23 DIAGNOSIS — M4728 Other spondylosis with radiculopathy, sacral and sacrococcygeal region: Secondary | ICD-10-CM | POA: Diagnosis not present

## 2021-06-23 DIAGNOSIS — M4725 Other spondylosis with radiculopathy, thoracolumbar region: Secondary | ICD-10-CM | POA: Diagnosis not present

## 2021-07-07 DIAGNOSIS — M4726 Other spondylosis with radiculopathy, lumbar region: Secondary | ICD-10-CM | POA: Diagnosis not present

## 2021-07-07 DIAGNOSIS — M4728 Other spondylosis with radiculopathy, sacral and sacrococcygeal region: Secondary | ICD-10-CM | POA: Diagnosis not present

## 2021-07-07 DIAGNOSIS — M9902 Segmental and somatic dysfunction of thoracic region: Secondary | ICD-10-CM | POA: Diagnosis not present

## 2021-07-07 DIAGNOSIS — M4725 Other spondylosis with radiculopathy, thoracolumbar region: Secondary | ICD-10-CM | POA: Diagnosis not present

## 2021-07-21 DIAGNOSIS — M4728 Other spondylosis with radiculopathy, sacral and sacrococcygeal region: Secondary | ICD-10-CM | POA: Diagnosis not present

## 2021-07-21 DIAGNOSIS — M4725 Other spondylosis with radiculopathy, thoracolumbar region: Secondary | ICD-10-CM | POA: Diagnosis not present

## 2021-07-21 DIAGNOSIS — M9902 Segmental and somatic dysfunction of thoracic region: Secondary | ICD-10-CM | POA: Diagnosis not present

## 2021-07-21 DIAGNOSIS — M4726 Other spondylosis with radiculopathy, lumbar region: Secondary | ICD-10-CM | POA: Diagnosis not present

## 2021-08-03 DIAGNOSIS — M9902 Segmental and somatic dysfunction of thoracic region: Secondary | ICD-10-CM | POA: Diagnosis not present

## 2021-08-03 DIAGNOSIS — M4725 Other spondylosis with radiculopathy, thoracolumbar region: Secondary | ICD-10-CM | POA: Diagnosis not present

## 2021-08-03 DIAGNOSIS — M4728 Other spondylosis with radiculopathy, sacral and sacrococcygeal region: Secondary | ICD-10-CM | POA: Diagnosis not present

## 2021-08-03 DIAGNOSIS — M4726 Other spondylosis with radiculopathy, lumbar region: Secondary | ICD-10-CM | POA: Diagnosis not present

## 2021-08-19 DIAGNOSIS — M4728 Other spondylosis with radiculopathy, sacral and sacrococcygeal region: Secondary | ICD-10-CM | POA: Diagnosis not present

## 2021-08-19 DIAGNOSIS — M9902 Segmental and somatic dysfunction of thoracic region: Secondary | ICD-10-CM | POA: Diagnosis not present

## 2021-08-19 DIAGNOSIS — M4725 Other spondylosis with radiculopathy, thoracolumbar region: Secondary | ICD-10-CM | POA: Diagnosis not present

## 2021-08-19 DIAGNOSIS — M4726 Other spondylosis with radiculopathy, lumbar region: Secondary | ICD-10-CM | POA: Diagnosis not present

## 2021-09-08 DIAGNOSIS — M9902 Segmental and somatic dysfunction of thoracic region: Secondary | ICD-10-CM | POA: Diagnosis not present

## 2021-09-08 DIAGNOSIS — M4728 Other spondylosis with radiculopathy, sacral and sacrococcygeal region: Secondary | ICD-10-CM | POA: Diagnosis not present

## 2021-09-08 DIAGNOSIS — M4725 Other spondylosis with radiculopathy, thoracolumbar region: Secondary | ICD-10-CM | POA: Diagnosis not present

## 2021-09-08 DIAGNOSIS — M4726 Other spondylosis with radiculopathy, lumbar region: Secondary | ICD-10-CM | POA: Diagnosis not present

## 2021-09-22 DIAGNOSIS — M9902 Segmental and somatic dysfunction of thoracic region: Secondary | ICD-10-CM | POA: Diagnosis not present

## 2021-09-22 DIAGNOSIS — M4726 Other spondylosis with radiculopathy, lumbar region: Secondary | ICD-10-CM | POA: Diagnosis not present

## 2021-09-22 DIAGNOSIS — M4728 Other spondylosis with radiculopathy, sacral and sacrococcygeal region: Secondary | ICD-10-CM | POA: Diagnosis not present

## 2021-09-22 DIAGNOSIS — M4725 Other spondylosis with radiculopathy, thoracolumbar region: Secondary | ICD-10-CM | POA: Diagnosis not present

## 2022-07-26 IMAGING — US US EXTREM LOW VENOUS
1 series · 13 of 24 positions shown · non-contrast
Comparison: None.

CLINICAL DATA: 34-year-old with right lower extremity pain and
edema.



[Series 1: us extrem low venous · 0.08mm/px · 13 of 81 slices shown]
[im 1/81]
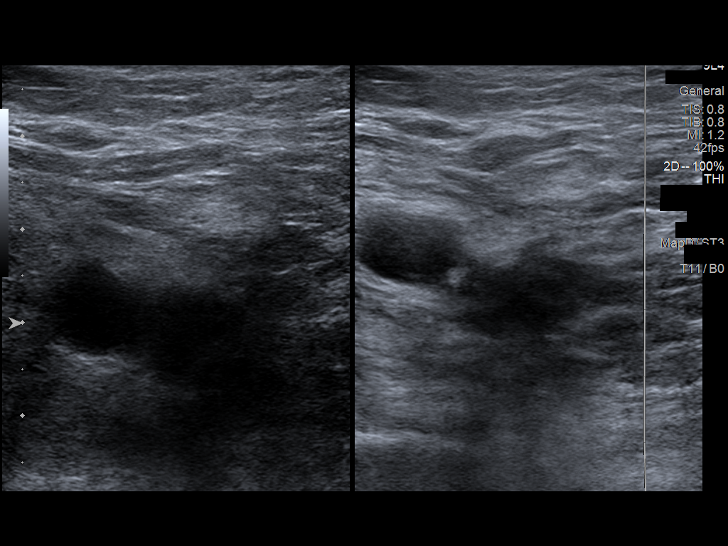
[im 7/81]
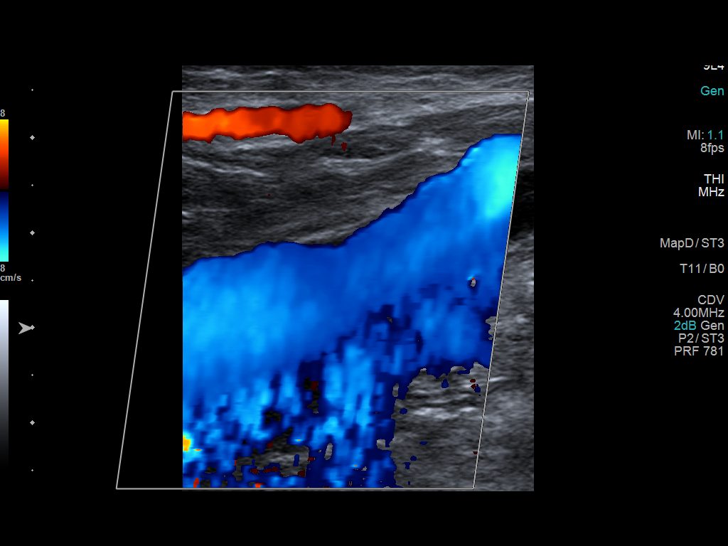
[im 14/81]
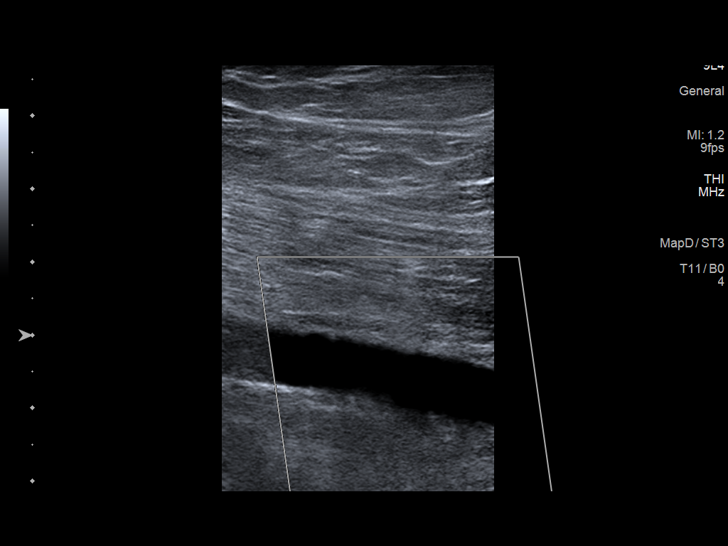
[im 21/81]
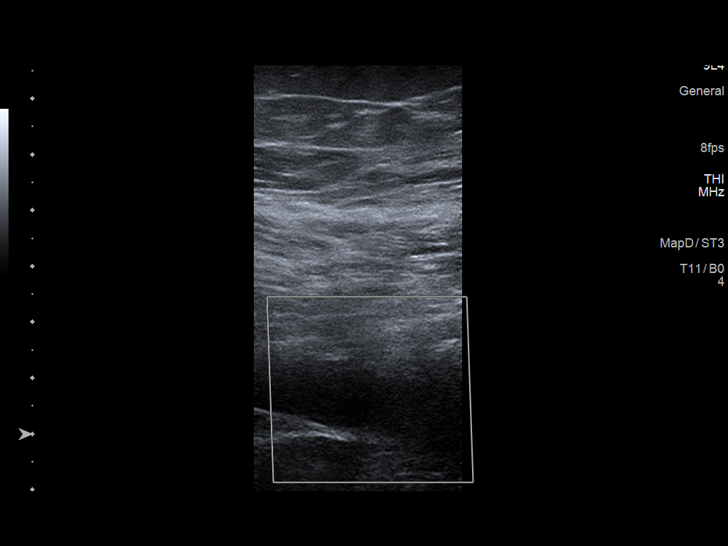
[im 28/81]
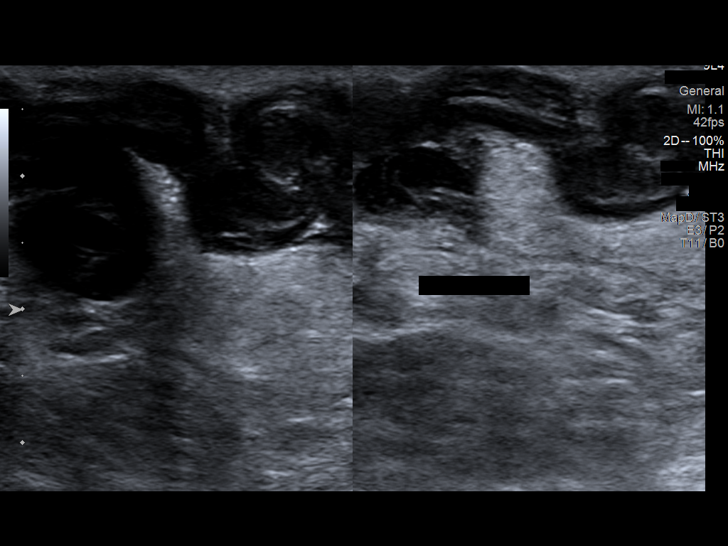
[im 35/81]
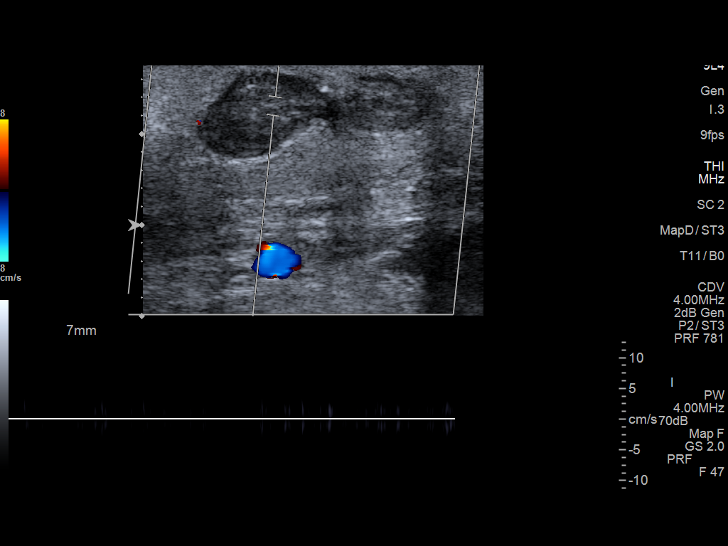
[im 42/81]
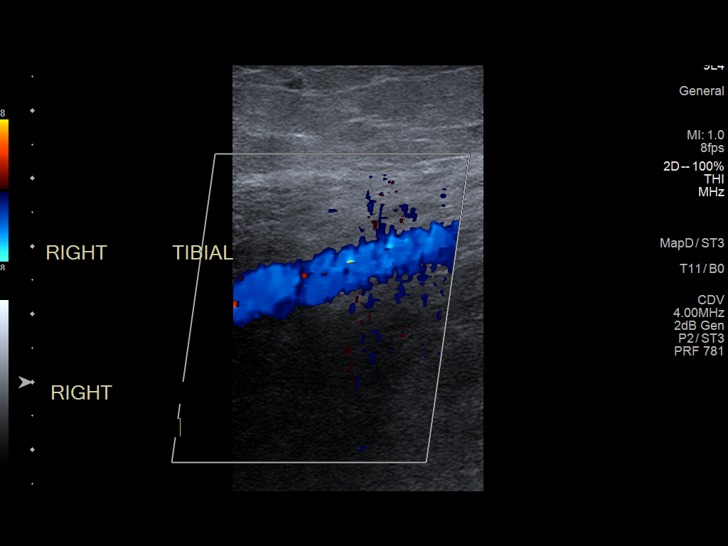
[im 46/81]
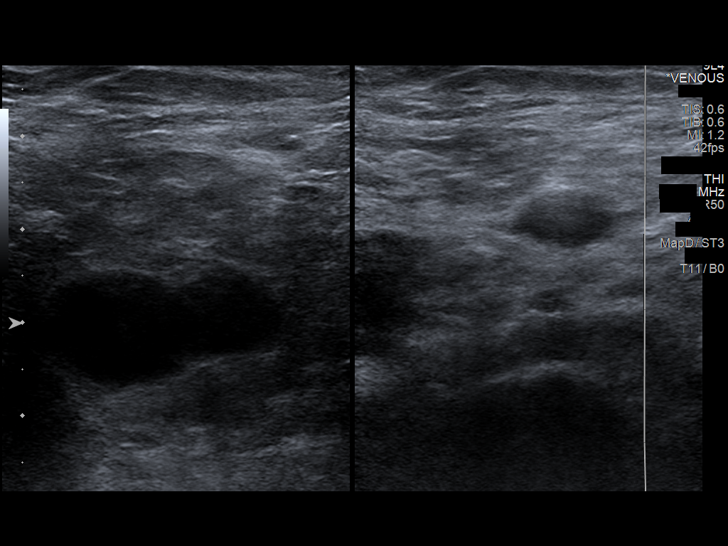
[im 53/81]
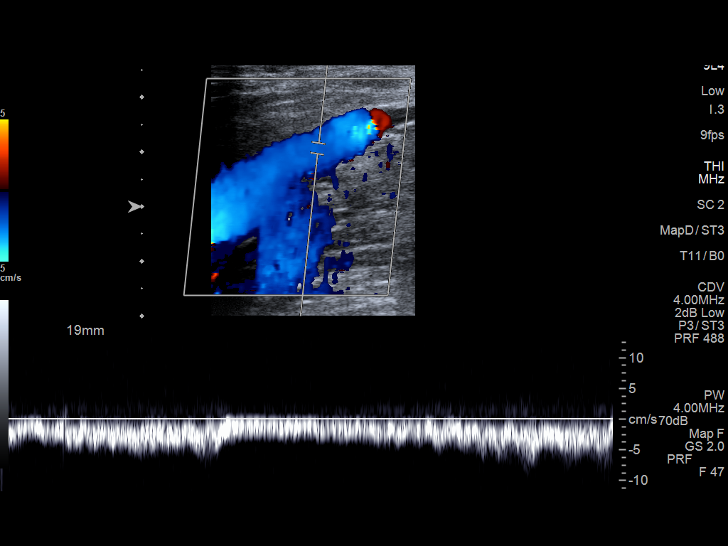
[im 60/81]
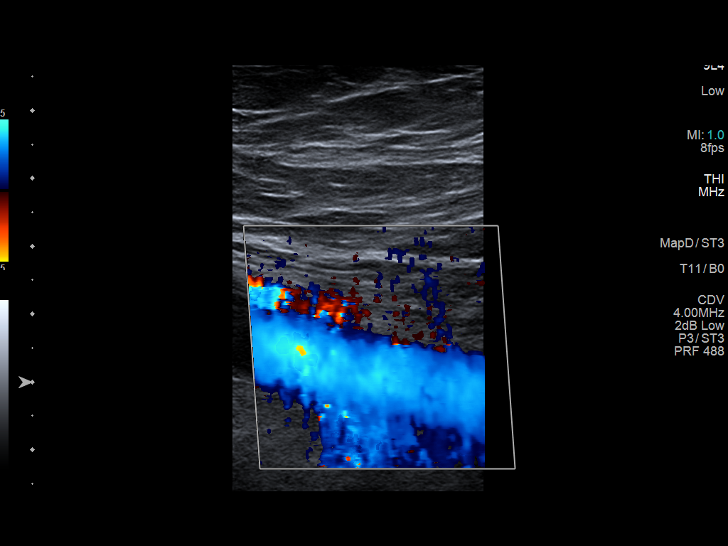
[im 67/81]
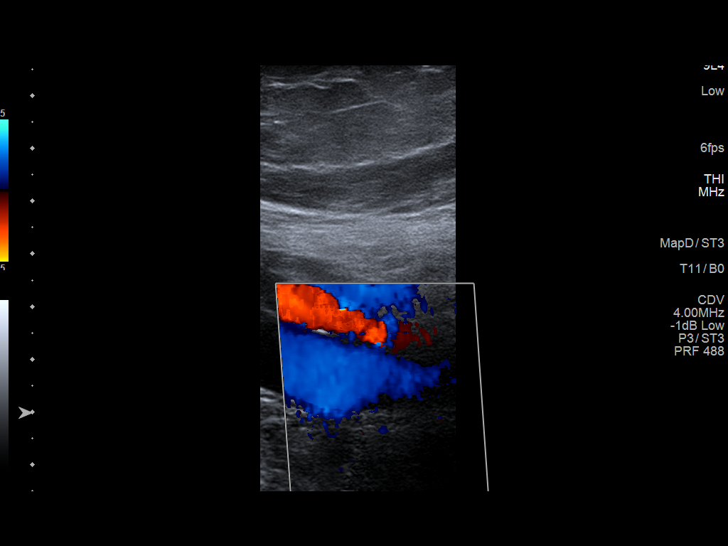
[im 74/81]
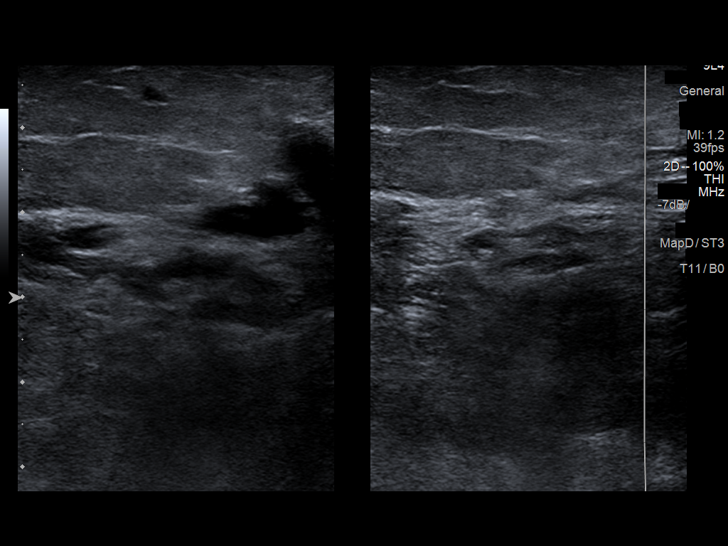
[im 81/81]
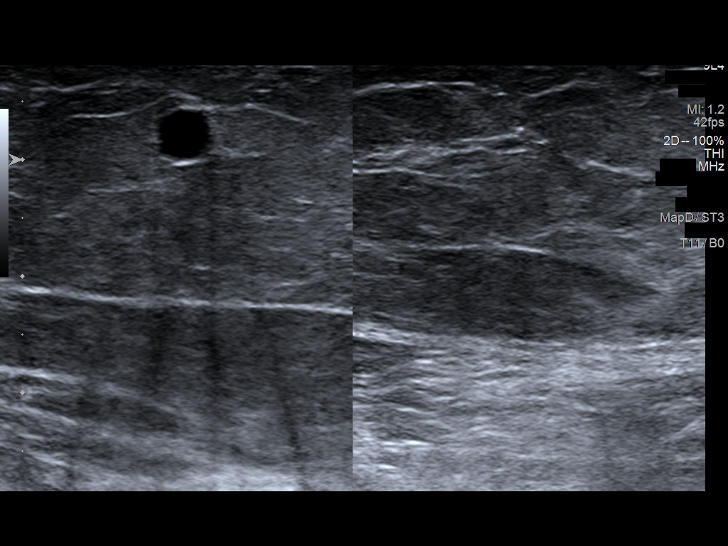

[13 of 24 positions shown; findings below may reference images not displayed]

FINDINGS: RIGHT LOWER EXTREMITY

Common Femoral Vein: No evidence of thrombus. Normal
compressibility, respiratory phasicity and response to augmentation.

Saphenofemoral Junction: No evidence of thrombus. Normal
compressibility and flow on color Doppler imaging.

Profunda Femoral Vein: No evidence of thrombus. Normal
compressibility and flow on color Doppler imaging.

Femoral Vein: No evidence of thrombus. Normal compressibility,
respiratory phasicity and response to augmentation.

Popliteal Vein: No evidence of thrombus. Normal compressibility,
respiratory phasicity and response to augmentation.

Calf Veins: No evidence of thrombus. Normal compressibility and flow
on color Doppler imaging.

Superficial Great Saphenous Vein: No evidence of thrombus. Normal
compressibility.

Other Findings: Large thrombosed varicosities in the medial distal
thigh at the area of concern. Non compressible thrombosed
varicosities in the posterior calf at the area of concern.

LEFT LOWER EXTREMITY

Common Femoral Vein: No evidence of thrombus. Normal
compressibility, respiratory phasicity and response to augmentation.

Saphenofemoral Junction: No evidence of thrombus. Normal
compressibility and flow on color Doppler imaging.

Profunda Femoral Vein: No evidence of thrombus. Normal
compressibility and flow on color Doppler imaging.

Femoral Vein: No evidence of thrombus. Normal compressibility,
respiratory phasicity and response to augmentation.

Popliteal Vein: No evidence of thrombus. Normal compressibility and
color Doppler flow.

Calf Veins: No evidence of thrombus. Normal compressibility and flow
on color Doppler imaging.

Superficial Great Saphenous Vein: No evidence of thrombus. Normal
compressibility.

Other Findings:  None.
IMPRESSION: 1. Positive for superficial venous thrombosis in the right lower
extremity. Thrombosed superficial varicose veins in the right distal
thigh and right calf at the areas of concern. These large varicose
veins raise concern for underlying superficial venous insufficiency
in the right lower extremity.
2.  No evidence of deep venous thrombosis in the lower extremities.
